# Patient Record
Sex: Female | Born: 1990 | Hispanic: No | Smoking: Current every day smoker
Health system: Southern US, Community
[De-identification: ages and names within clinical notes are randomized; demographics above are authoritative.]

## PROBLEM LIST (undated history)

## (undated) DIAGNOSIS — G43909 Migraine, unspecified, not intractable, without status migrainosus: Secondary | ICD-10-CM

## (undated) DIAGNOSIS — E282 Polycystic ovarian syndrome: Secondary | ICD-10-CM

## (undated) DIAGNOSIS — D219 Benign neoplasm of connective and other soft tissue, unspecified: Secondary | ICD-10-CM

## (undated) HISTORY — DX: Benign neoplasm of connective and other soft tissue, unspecified: D21.9

## (undated) HISTORY — DX: Migraine, unspecified, not intractable, without status migrainosus: G43.909

## (undated) HISTORY — DX: Polycystic ovarian syndrome: E28.2

## (undated) HISTORY — PX: NO PAST SURGERIES: SHX2092

---

## 2016-01-09 ENCOUNTER — Emergency Department
Admission: EM | Admit: 2016-01-09 | Discharge: 2016-01-09 | Disposition: A | Discharge status: Home / Self Care | Payer: BLUE CROSS/BLUE SHIELD | Attending: Emergency Medicine | Admitting: Emergency Medicine

## 2016-01-09 ENCOUNTER — Encounter: Payer: Self-pay | Admitting: Emergency Medicine

## 2016-01-09 DIAGNOSIS — T384X5A Adverse effect of oral contraceptives, initial encounter: Secondary | ICD-10-CM | POA: Insufficient documentation

## 2016-01-09 DIAGNOSIS — N939 Abnormal uterine and vaginal bleeding, unspecified: Secondary | ICD-10-CM

## 2016-01-09 DIAGNOSIS — F17200 Nicotine dependence, unspecified, uncomplicated: Secondary | ICD-10-CM | POA: Insufficient documentation

## 2016-01-09 LAB — POCT PREGNANCY, URINE: PREG TEST UR: NEGATIVE

## 2016-01-09 MED ORDER — KETOROLAC TROMETHAMINE 30 MG/ML IJ SOLN
30.0000 mg | Freq: Once | INTRAMUSCULAR | Status: AC
  Administered 2016-01-09: 30 mg via INTRAMUSCULAR
  Filled 2016-01-09: qty 1

## 2016-01-09 NOTE — Discharge Instructions (Signed)
Abnormal Uterine Bleeding Abnormal uterine bleeding means bleeding from the vagina that is not your normal menstrual period. This can be:  Bleeding or spotting between periods.  Bleeding after sex (sexual intercourse).  Bleeding that is heavier or more than normal.  Periods that last longer than usual.  Bleeding after menopause. There are many problems that may cause this. Treatment will depend on the cause of the bleeding. Any kind of bleeding that is not normal should be reviewed by your doctor.  HOME CARE Watch your condition for any changes. These actions may lessen any discomfort you are having:  Do not use tampons or douches as told by your doctor.  Change your pads often. You should get regular pelvic exams and Pap tests. Keep all appointments for tests as told by your doctor. GET HELP IF:  You are bleeding for more than 1 week.  You feel dizzy at times. GET HELP RIGHT AWAY IF:   You pass out.  You have to change pads every 15 to 30 minutes.  You have belly pain.  You have a fever.  You become sweaty or weak.  You are passing large blood clots from the vagina.  You feel sick to your stomach (nauseous) and throw up (vomit). MAKE SURE YOU:  Understand these instructions.  Will watch your condition.  Will get help right away if you are not doing well or get worse.   This information is not intended to replace advice given to you by your health care provider. Make sure you discuss any questions you have with your health care provider.   Document Released: 07/31/2009 Document Revised: 10/08/2013 Document Reviewed: 05/02/2013 Elsevier Interactive Patient Education 2016 [employer].  Oral Contraception Information Oral contraceptive pills (OCPs) are medicines taken to prevent pregnancy. OCPs work by preventing the ovaries from releasing eggs. The hormones in OCPs also cause the cervical mucus to thicken, preventing the sperm from entering the uterus. The  hormones also cause the uterine lining to become thin, not allowing a fertilized egg to attach to the inside of the uterus. OCPs are highly effective when taken exactly as prescribed. However, OCPs do not prevent sexually transmitted diseases (STDs). Safe sex practices, such as using condoms along with the pill, can help prevent STDs.  Before taking the pill, you may have a physical exam and Pap test. Your health care provider may order blood tests. The health care provider will make sure you are a good candidate for oral contraception. Discuss with your health care provider the possible side effects of the OCP you may be prescribed. When starting an OCP, it can take 2 to 3 months for the body to adjust to the changes in hormone levels in your body.  TYPES OF ORAL CONTRACEPTION  The combination pill--This pill contains estrogen and progestin (synthetic progesterone) hormones. The combination pill comes in 21-day, 28-day, or 91-day packs. Some types of combination pills are meant to be taken continuously (365-day pills). With 21-day packs, you do not take pills for 7 days after the last pill. With 28-day packs, the pill is taken every day. The last 7 pills are without hormones. Certain types of pills have more than 21 hormone-containing pills. With 91-day packs, the first 84 pills contain both hormones, and the last 7 pills contain no hormones or contain estrogen only.  The minipill--This pill contains the progesterone hormone only. The pill is taken every day continuously. It is very important to take the pill at the same time each  day. The minipill comes in packs of 28 pills. All 28 pills contain the hormone.  ADVANTAGES OF ORAL CONTRACEPTIVE PILLS  Decreases premenstrual symptoms.   Treats menstrual period cramps.   Regulates the menstrual cycle.   Decreases a heavy menstrual flow.   May treatacne, depending on the type of pill.   Treats abnormal uterine bleeding.   Treats polycystic  ovarian syndrome.   Treats endometriosis.   Can be used as emergency contraception.  THINGS THAT CAN MAKE ORAL CONTRACEPTIVE PILLS LESS EFFECTIVE OCPs can be less effective if:   You forget to take the pill at the same time every day.   You have a stomach or intestinal disease that lessens the absorption of the pill.   You take OCPs with other medicines that make OCPs less effective, such as antibiotics, certain HIV medicines, and some seizure medicines.   You take expired OCPs.   You forget to restart the pill on day 7, when using the packs of 21 pills.  RISKS ASSOCIATED WITH ORAL CONTRACEPTIVE PILLS  Oral contraceptive pills can sometimes cause side effects, such as:  Headache.  Nausea.  Breast tenderness.  Irregular bleeding or spotting. Combination pills are also associated with a small increased risk of:  Blood clots.  Heart attack.  Stroke.   This information is not intended to replace advice given to you by your health care provider. Make sure you discuss any questions you have with your health care provider.   Document Released: 12/24/2002 Document Revised: 07/24/2013 Document Reviewed: 03/24/2013 Elsevier Interactive Patient Education [employer].

## 2016-01-09 NOTE — ED Notes (Signed)
Assisted with pelvic exam.

## 2016-01-09 NOTE — ED Provider Notes (Signed)
[facility] Emergency Department Provider Note  ____________________________________________  Time seen: Approximately 1:13 PM  I have reviewed the triage vital signs and the nursing notes.   HISTORY  Chief Complaint Vaginal Bleeding    HPI Amanda Landry is a 25 y.o. female , NAD, presents to the emergency department with 1 week history of vaginal bleeding accompanied by abdominal cramping. States she is on [place] which is the generic version of [place]. States she's been on this medication before and had no adverse events.Over this last 3 months cycle she notes that she completed the first month and on the first day of the second month of pills she began to spot and bleed. States the bleeding at times and Flonase. Can have bleeding at night and then it stops in the morning and vice versa. Denies any vaginal discharge or changes in sexual partners. Does not believe that she is pregnant. Has had no lower back pain. Does not feel weak or fatigued. Has not had any chest pain,palpitations, racing heart, shortness of breath. Has an appointment with a new OB/GYN the first week of April. So notes she has a history of uterine fibroids.   History reviewed. No pertinent past medical history.  There are no active problems to display for this patient.   History reviewed. No pertinent past surgical history.  No current outpatient prescriptions on file.  Allergies Penicillins  History reviewed. No pertinent family history.  Social History Social History  Substance Use Topics  . Smoking status: Current Every Day Smoker  . Smokeless tobacco: None  . Alcohol Use: Yes     Review of Systems  Constitutional: No fever/chills, fatigue Eyes: No visual changes. Cardiovascular: No chest pain, heart racing, palpitations. Respiratory: No shortness of breath. No wheezing.  Gastrointestinal: Positive abdominal pain, cramping.  No nausea, vomiting.  No diarrhea.  No  constipation. Genitourinary: Positive vaginal bleeding. Negative for dysuria, hematuria. No urinary hesitancy, urgency or increased frequency. Musculoskeletal: Negative for back pain.  Skin: Negative for rash. Neurological: Negative for headaches, focal weakness or numbness. 10-point ROS otherwise negative.  ____________________________________________   PHYSICAL EXAM:  VITAL SIGNS: ED Triage Vitals  Enc Vitals Group     BP 01/09/16 1240 137/71 mmHg     Pulse Rate 01/09/16 1240 87     Resp 01/09/16 1240 20     Temp 01/09/16 1240 98.1 F (36.7 C)     Temp Source 01/09/16 1240 Oral     SpO2 01/09/16 1240 100 %     Weight 01/09/16 1240 170 lb (77.111 kg)     Height 01/09/16 1240 5\' 5"  (1.651 m)     Head Cir --      Peak Flow --      Pain Score 01/09/16 1241 6     Pain Loc --      Pain Edu? --      Excl. in [place]? --     Constitutional: Alert and oriented. Well appearing and in no acute distress. Eyes: Conjunctivae are normal.  Head: Atraumatic. Hematologic/Lymph:  No inguinal lymphadenopathy Cardiovascular: Good peripheral circulation. Respiratory: Normal respiratory effort without tachypnea or retractions.  Gastrointestinal: Soft and nontender in all quadrants. No distention, guarding.  Genitourinary:  External genitalia within normal limits and tender stage V. Vaginal vault without skin sores or swelling. No discharge or bleeding noted. Cervix within normal limits and atraumatic. No CMT. Musculoskeletal: No lower extremity tenderness nor edema.  No joint effusions. Neurologic:  Normal speech and language. No  gross focal neurologic deficits are appreciated.  Skin:  Skin is warm, dry and intact. No rash noted. Psychiatric: Mood and affect are normal. Speech and behavior are normal. Patient exhibits appropriate insight and judgement.   ____________________________________________   LABS (all labs ordered are listed, but only abnormal results are displayed)  Labs Reviewed   POCT PREGNANCY, URINE  POC URINE PREG, ED   ____________________________________________  EKG  None ____________________________________________  RADIOLOGY  None ____________________________________________    PROCEDURES  Procedure(s) performed: None    Medications  ketorolac (TORADOL) 30 MG/ML injection 30 mg (30 mg Intramuscular Given 01/09/16 1339)     ____________________________________________   INITIAL IMPRESSION / ASSESSMENT AND PLAN / ED COURSE  Pertinent lab results that were available during my care of the patient were reviewed by me and considered in my medical decision making (see chart for details).  Patient's diagnosis is consistent with abnormal uterine bleeding due to oral contraceptives.Patient will be discharged home with instructions to follow-up with her OB/GYN at in [facility] as currently scheduled.  Patient is given ED precautions to return to the ED for any worsening or new symptoms.    ____________________________________________  FINAL CLINICAL IMPRESSION(S) / ED DIAGNOSES  Final diagnoses:  Abnormal uterine bleeding  Oral contraceptive causing adverse effect in therapeutic use, initial encounter      NEW MEDICATIONS STARTED DURING THIS VISIT:  New Prescriptions   No medications on file         Braxton Feathers, PA-C 01/09/16 [place], MD 01/09/16 1553

## 2016-01-09 NOTE — ED Notes (Signed)
Pt to ed with c/o vaginal bleeding and pelvic cramping that started about 1 week ago.  Pt states she is on birth control that causes menstrual cycle every 3 months, however, this vaginal bleeding started on the first day of the second month.  Reports she took abx at the end of feb for toothache but did not have bleeding then.

## 2016-01-21 ENCOUNTER — Ambulatory Visit (INDEPENDENT_AMBULATORY_CARE_PROVIDER_SITE_OTHER): Payer: BLUE CROSS/BLUE SHIELD | Admitting: Obstetrics and Gynecology

## 2016-01-21 ENCOUNTER — Encounter: Payer: Self-pay | Admitting: Obstetrics and Gynecology

## 2016-01-21 VITALS — BP 117/71 | HR 83 | Ht 65.0 in | Wt 184.3 lb

## 2016-01-21 DIAGNOSIS — E282 Polycystic ovarian syndrome: Secondary | ICD-10-CM

## 2016-01-21 DIAGNOSIS — R638 Other symptoms and signs concerning food and fluid intake: Secondary | ICD-10-CM | POA: Insufficient documentation

## 2016-01-21 DIAGNOSIS — Z01419 Encounter for gynecological examination (general) (routine) without abnormal findings: Secondary | ICD-10-CM

## 2016-01-21 NOTE — Progress Notes (Signed)
Patient ID: Amanda Landry, female   DOB: [birth year], 25 y.o.   MRN: [id number] ANNUAL PREVENTATIVE CARE GYN  ENCOUNTER NOTE  Subjective:       Amanda Landry is a 25 y.o. [phone] female here for a routine annual gynecologic exam.  Current complaints: 1.  Want an u/s d/t pcos      Gynecologic History Patient's last menstrual period was 01/09/2016. Contraception: OCP (estrogen/progesterone) Last Pap: 2016. Results were: {wnl Menarche-eights 10 Intervals-irregular, every 2 weeks to every 2 months Duration of flow-5-7 days long birth control pills; 9-10 days off birth control pills (and heavy) Pap smear history: No abnormals STI history: Negative History of fibroids.  Obstetric History OB History  Gravida Para Term Preterm AB SAB TAB Ectopic Multiple Living  3 2 2  0 1 1 0 0 0 2    # Outcome Date GA Lbr Len/2nd Weight Sex Delivery Anes PTL Lv  3 SAB 2016          2 Term 2013   7 lb 1.6 oz (3.221 kg) M Vag-Spont   Y  1 Term 2011   5 lb 11.2 oz (2.586 kg) M Vag-Spont   Y      Past Medical History  Diagnosis Date  . PCOS (polycystic ovarian syndrome)   . Migraines   . Fibroid     History reviewed. No pertinent past surgical history.  No current outpatient prescriptions on file prior to visit.   No current facility-administered medications on file prior to visit.    Allergies  Allergen Reactions  . Penicillins Anaphylaxis    Social History   Social History  . Marital Status: Legally Separated    Spouse Name: N/A  . Number of Children: N/A  . Years of Education: N/A   Occupational History  . Not on file.   Social History Main Topics  . Smoking status: Current Every Day Smoker -- 0.25 packs/day    Types: Cigarettes  . Smokeless tobacco: Not on file  . Alcohol Use: Yes     Comment: weekly  . Drug Use: No  . Sexual Activity: Not Currently    Birth Control/ Protection: Pill   Other Topics Concern  . Not on file   Social History Narrative    Family  History  Problem Relation Age of Onset  . Cancer Neg Hx   . Diabetes Neg Hx   . Heart disease Neg Hx     The following portions of the patient's history were reviewed and updated as appropriate: allergies, current medications, past family history, past medical history, past social history, past surgical history and problem list.  Review of Systems ROS Review of Systems - General ROS: negative for - chills, fatigue, fever, hot flashes, night sweats, weight gain or weight loss Psychological ROS: negative for - anxiety, decreased libido, depression, mood swings, physical abuse or sexual abuse Ophthalmic ROS: negative for - blurry vision, eye pain or loss of vision ENT ROS: negative for - headaches, hearing change, visual changes or vocal changes Allergy and Immunology ROS: negative for - hives, itchy/watery eyes or seasonal allergies Hematological and Lymphatic ROS: negative for - bleeding problems, bruising, swollen lymph nodes or weight loss Endocrine ROS: negative for - galactorrhea, hair pattern changes, hot flashes, malaise/lethargy, mood swings, palpitations, polydipsia/polyuria, skin changes, temperature intolerance or unexpected weight changes Breast ROS: negative for - new or changing breast lumps or nipple discharge Respiratory ROS: negative for - cough or shortness of breath Cardiovascular ROS: negative for - chest  pain, irregular heartbeat, palpitations or shortness of breath Gastrointestinal ROS: no abdominal pain, change in bowel habits, or black or bloody stools Genito-Urinary ROS: no dysuria, trouble voiding, or hematuria Musculoskeletal ROS: negative for - joint pain or joint stiffness Neurological ROS: negative for - bowel and bladder control changes Dermatological ROS: negative for rash and skin lesion changes   Objective:   BP 117/71 mmHg  Pulse 83  Ht 5\' 5"  (1.651 m)  Wt 184 lb 4.8 oz (83.598 kg)  BMI 30.67 kg/m2  LMP 01/09/2016 CONSTITUTIONAL: Well-developed,  well-nourished female in no acute distress.  PSYCHIATRIC: Normal mood and affect. Normal behavior. Normal judgment and thought content. [place]: Alert and oriented to person, place, and time. Normal muscle tone coordination. No cranial nerve deficit noted. HENT:  Normocephalic, atraumatic, External right and left ear normal. Oropharynx is clear and moist EYES: Conjunctivae and EOM are normal. Pupils are equal, round, and reactive to light. No scleral icterus.  NECK: Normal range of motion, supple, no masses.  Normal thyroid.  SKIN: Skin is warm and dry. No rash noted. Not diaphoretic. No erythema. No pallor. CARDIOVASCULAR: Normal heart rate noted, regular rhythm, no murmur. RESPIRATORY: Clear to auscultation bilaterally. Effort and breath sounds normal, no problems with respiration noted. BREASTS: Symmetric in size. No masses, skin changes, nipple drainage, or lymphadenopathy. ABDOMEN: Soft, normal bowel sounds, no distention noted.  No tenderness, rebound or guarding.  BLADDER: Normal PELVIC:  External Genitalia: Normal  BUS: Normal  Vagina: Normal  Cervix: Normal  Uterus: Normal  Adnexa: Normal  RV: External Exam NormaI  MUSCULOSKELETAL: Normal range of motion. No tenderness.  No cyanosis, clubbing, or edema.  2+ distal pulses. LYMPHATIC: No Axillary, Supraclavicular, or Inguinal Adenopathy.    Assessment:   Annual gynecologic examination 25 y.o. Contraception: ocp Jolessa bmi-30  History of PCO History of irregular menstrual cycles  Plan:  Pap: Pap, Reflex if ASCUS-ct/ng Mammogram: Not Indicated Stool Guaiac Testing:  Not Indicated Labs: lipid vit d tsh a1c fbs Routine preventative health maintenance measures emphasized: Exercise/Diet/Weight control, Tobacco Warnings and Alcohol/Substance use risks Management of polycystic ovary disease was reviewed. For patient  not wanting to be pregnant, birth control pill use will be recommended. For patient wanting to get pregnant,  Clomid will be started Return to [place], CMA  Brayton Mars, MD  Note: This dictation was prepared with Dragon dictation along with smaller phrase technology. Any transcriptional errors that result from this process are unintentional.

## 2016-01-27 NOTE — Patient Instructions (Signed)
1. Pap smear 2. Self breast exam encouraged monthly 3. Healthy eating and exercise with gradual weight loss is encouraged 4. Refill oral contraceptives 5. Continue prenatal vitamins 6. Return in 1 year or as needed

## 2016-01-28 LAB — PAP IG, CT-NG, RFX HPV ASCU
Chlamydia, Nuc. Acid Amp: NEGATIVE
GONOCOCCUS BY NUCLEIC ACID AMP: NEGATIVE
PAP Smear Comment: 0

## 2016-02-03 ENCOUNTER — Ambulatory Visit: Admitting: Obstetrics and Gynecology

## 2016-02-03 ENCOUNTER — Telehealth: Payer: Self-pay | Admitting: *Deleted

## 2016-02-03 ENCOUNTER — Other Ambulatory Visit (INDEPENDENT_AMBULATORY_CARE_PROVIDER_SITE_OTHER): Payer: BLUE CROSS/BLUE SHIELD | Admitting: Obstetrics and Gynecology

## 2016-02-03 DIAGNOSIS — Z79899 Other long term (current) drug therapy: Secondary | ICD-10-CM

## 2016-02-03 DIAGNOSIS — E282 Polycystic ovarian syndrome: Secondary | ICD-10-CM

## 2016-02-03 LAB — POCT URINE PREGNANCY: Preg Test, Ur: NEGATIVE

## 2016-02-03 MED ORDER — MEDROXYPROGESTERONE ACETATE 10 MG PO TABS: 10.0000 mg | ORAL_TABLET | Freq: Every day | ORAL | Status: DC

## 2016-02-03 MED ORDER — CLOMIPHENE CITRATE 50 MG PO TABS: 50.0000 mg | ORAL_TABLET | Freq: Every day | ORAL | Status: DC

## 2016-02-03 NOTE — Progress Notes (Unsigned)
Per mad she needs to get upt. If upt is neg. Can send in clomid and provera. Advised to have progesterone drawn day 22.

## 2016-02-03 NOTE — Patient Instructions (Addendum)
Pt aware upt neg. Meds erx. Progesterone orderded.

## 2016-02-03 NOTE — Telephone Encounter (Signed)
Pt signed MRR this am for her 2 u/s. She stopped her ocp on 4-6. Would like a rx for clomid. Pt aware will send message to mad.

## 2016-02-03 NOTE — Telephone Encounter (Signed)
Patient called and wanted to touch base with you about her Ultrasound results that is coming in from another office.  Patient already signed a medical release form. Patient states you can call her when you receive the results. . Pt is requesting that Crystal call her back. Call back number [phone]. Thanks

## 2016-02-04 LAB — VITAMIN D 25 HYDROXY (VIT D DEFICIENCY, FRACTURES): VIT D 25 HYDROXY: 32.9 ng/mL (ref 30.0–100.0)

## 2016-02-04 LAB — LIPID PANEL
CHOL/HDL RATIO: 6.7 ratio — AB (ref 0.0–4.4)
Cholesterol, Total: 222 mg/dL — ABNORMAL HIGH (ref 100–199)
HDL: 33 mg/dL — AB (ref >39)
LDL Calculated: 127 mg/dL — ABNORMAL HIGH (ref 0–99)
Triglycerides: 312 mg/dL — ABNORMAL HIGH (ref 0–149)
VLDL Cholesterol Cal: 62 mg/dL — ABNORMAL HIGH (ref 5–40)

## 2016-02-04 LAB — GLUCOSE, RANDOM: Glucose: 109 mg/dL — ABNORMAL HIGH (ref 65–99)

## 2016-02-04 LAB — TSH: TSH: 0.805 u[IU]/mL (ref 0.450–4.500)

## 2016-02-04 LAB — HEMOGLOBIN A1C
ESTIMATED AVERAGE GLUCOSE: 111 mg/dL
HEMOGLOBIN A1C: 5.5 % (ref 4.8–5.6)

## 2016-02-04 NOTE — Telephone Encounter (Signed)
Pt stopped by office yesterday. upt neg. meds and labs ordered.

## 2016-02-12 ENCOUNTER — Telehealth: Payer: Self-pay | Admitting: Obstetrics and Gynecology

## 2016-02-12 NOTE — Telephone Encounter (Signed)
Pt aware if day 22 falls on weekend to come the day before to have drawn.

## 2016-02-12 NOTE — Telephone Encounter (Signed)
Amanda Landry HAS A QUESTON ABOUT HER PROVERA AND CLOMID

## 2016-03-09 ENCOUNTER — Encounter: Payer: Self-pay | Admitting: Obstetrics and Gynecology

## 2016-03-10 ENCOUNTER — Other Ambulatory Visit: Payer: BLUE CROSS/BLUE SHIELD

## 2016-03-10 DIAGNOSIS — E282 Polycystic ovarian syndrome: Secondary | ICD-10-CM

## 2016-03-11 LAB — PROGESTERONE: PROGESTERONE: 18.7 ng/mL

## 2016-03-18 ENCOUNTER — Telehealth: Payer: Self-pay | Admitting: Obstetrics and Gynecology

## 2016-03-18 NOTE — Telephone Encounter (Signed)
Ms. Ensminger called saying she doesn't think the Clomid worked. She didn't have a positive pregnancy test and believes her cycle will begin today. She's wondering if there's another medication she can take. She'd like a phone call regarding this.  Pt's ph# [phone] Thank you.

## 2016-03-21 MED ORDER — CLOMIPHENE CITRATE 50 MG PO TABS: 50.0000 mg | ORAL_TABLET | Freq: Every day | ORAL | Status: AC

## 2016-03-21 NOTE — Telephone Encounter (Signed)
Ok per mad- to refill clomid. Pt aware.

## 2016-04-26 ENCOUNTER — Encounter: Payer: Self-pay | Admitting: Obstetrics and Gynecology

## 2016-04-26 ENCOUNTER — Ambulatory Visit (INDEPENDENT_AMBULATORY_CARE_PROVIDER_SITE_OTHER): Payer: BLUE CROSS/BLUE SHIELD | Admitting: Obstetrics and Gynecology

## 2016-04-26 VITALS — BP 134/83 | HR 90 | Ht 65.0 in | Wt 183.4 lb

## 2016-04-26 DIAGNOSIS — N970 Female infertility associated with anovulation: Secondary | ICD-10-CM | POA: Insufficient documentation

## 2016-04-26 DIAGNOSIS — E282 Polycystic ovarian syndrome: Secondary | ICD-10-CM | POA: Diagnosis not present

## 2016-04-26 NOTE — Patient Instructions (Signed)
1. Clomid is on back order 2. Call if no menses by day 30 of cycle; we will then call in medroxyprogesterone acetate to cause a cycle which will then be followed by Clomid therapy as written. 3. Return in 4 months for follow-up 4. Continue vitamins daily

## 2016-04-26 NOTE — Progress Notes (Signed)
Chief complaint: 1. PCO 2. Anovulatory infertility  Patient presents for follow-up on Clomid therapy. She has taken it for 2 cycles now and had normal spontaneous menses at the appropriate time. Serum progesterone level was 18.6 which confirmed ovulatory status.  Patient cannot take Clomid therapy this month because of the medication being on back order  Patient will continue Clomid therapy when it is available up to a total of 6 cycles. If she does not conceive, we will then refer her to reproductive endocrinology for further management  ASSESSMENT: 1. PCO 2. Anovulatory infertility 3. Successful Clomid ovulation induction based on day 22 serum progesterone at 50 mg per day dosage  PLAN: 1. Continue Clomid for a total of 6 cycles. 2. Patient is to hold off on medication this month because of Clomid back order. 3. Return in 4 months for follow-up or sooner if pregnant 4. Continue prenatal vitamins  A total of 15 minutes were spent face-to-face with the patient during this encounter and over half of that time dealt with counseling and coordination of care.  Brayton Mars, MD  Note: This dictation was prepared with Dragon dictation along with smaller phrase technology. Any transcriptional errors that result from this process are unintentional.

## 2016-08-30 ENCOUNTER — Encounter: Payer: Self-pay | Admitting: Obstetrics and Gynecology

## 2016-08-30 ENCOUNTER — Emergency Department
Admission: EM | Admit: 2016-08-30 | Discharge: 2016-08-30 | Disposition: A | Discharge status: Home / Self Care | Payer: BLUE CROSS/BLUE SHIELD | Attending: Emergency Medicine | Admitting: Emergency Medicine

## 2016-08-30 ENCOUNTER — Ambulatory Visit (INDEPENDENT_AMBULATORY_CARE_PROVIDER_SITE_OTHER): Payer: BLUE CROSS/BLUE SHIELD | Admitting: Obstetrics and Gynecology

## 2016-08-30 ENCOUNTER — Encounter: Payer: Self-pay | Admitting: Emergency Medicine

## 2016-08-30 VITALS — BP 117/67 | HR 92 | Ht 65.0 in | Wt 197.1 lb

## 2016-08-30 DIAGNOSIS — Z79899 Other long term (current) drug therapy: Secondary | ICD-10-CM | POA: Insufficient documentation

## 2016-08-30 DIAGNOSIS — G43109 Migraine with aura, not intractable, without status migrainosus: Secondary | ICD-10-CM | POA: Insufficient documentation

## 2016-08-30 DIAGNOSIS — F17210 Nicotine dependence, cigarettes, uncomplicated: Secondary | ICD-10-CM | POA: Diagnosis not present

## 2016-08-30 DIAGNOSIS — N970 Female infertility associated with anovulation: Secondary | ICD-10-CM | POA: Diagnosis not present

## 2016-08-30 DIAGNOSIS — G43909 Migraine, unspecified, not intractable, without status migrainosus: Secondary | ICD-10-CM | POA: Diagnosis present

## 2016-08-30 MED ORDER — SUMATRIPTAN SUCCINATE 100 MG PO TABS: 100.0000 mg | ORAL_TABLET | Freq: Once | ORAL | 11 refills | Status: DC | PRN

## 2016-08-30 MED ORDER — BUTALBITAL-APAP-CAFFEINE 50-325-40 MG PO TABS
2.0000 | ORAL_TABLET | ORAL | Status: AC
  Administered 2016-08-30: 2 via ORAL
  Filled 2016-08-30: qty 2

## 2016-08-30 MED ORDER — ONDANSETRON HCL 4 MG PO TABS
4.0000 mg | ORAL_TABLET | Freq: Once | ORAL | Status: AC
  Administered 2016-08-30: 4 mg via ORAL
  Filled 2016-08-30: qty 1

## 2016-08-30 MED ORDER — BUTALBITAL-APAP-CAFFEINE 50-325-40 MG PO TABS: 1.0000 | ORAL_TABLET | Freq: Four times a day (QID) | ORAL | 0 refills | Status: AC | PRN

## 2016-08-30 NOTE — ED Triage Notes (Signed)
Patient presents to the ED with headache x 3 weeks that she reports felt like it got worse this morning.  Patient reports nausea and blurry vision today.  Patient reports history of migraines but states she was in middle school with the last one that was this bad.  Patient states she used to take several medications for migraines but hasn't in years because they haven't been a problem.  Patient saw her PCP this morning and was prescribed sumatriptan but patient states she feels like her headache got worse and not better.  Patient reports photophobia.

## 2016-08-30 NOTE — Progress Notes (Signed)
Chief complaint: 1. Secondary anovulatory infertility 2. New onset migraine headaches  Patient has completed 3 cycles of Clomid for infertility without success. She is wanting to proceed with 3 more cycles now that her husband is back from [employer].  Patient has been experiencing almost daily migraines type headaches over the past 6 weeks. Headaches occur typically in the morning, occur in the occipital as well as right parietal lobe areas, are typically associated with  some nausea and visual changes. Patient has not had workup for this condition in the past.  OBJECTIVE: BP 117/67   Pulse 92   Ht 5\' 5"  (1.651 m)   Wt 197 lb 1.6 oz (89.4 kg)   LMP 08/07/2016   BMI 32.80 kg/m  Pleasant female in no acute distress. Alert and oriented. Affect is appropriate. PERRLA EOMI No neck stiffness or nuchal rigidity  ASSESSMENT: 1. 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 New onset migraine headaches with aura  PLAN: 1. Continue with Clomid ovulation induction for another 3 months 2. CT scan of the head with and without contrast 3. Imitrex 100 mg orally at onset of headache; repeat 1 dose if headache persists after 2 hours 4. Return in 3 months for follow-up  A total of 25 minutes were spent face-to-face with the patient during this encounter and over half of that time involved counseling and coordination of care.  Brayton Mars, MD  Note: This dictation was prepared with Dragon dictation along with smaller phrase technology. Any transcriptional errors that result from this process are unintentional.

## 2016-08-30 NOTE — ED Provider Notes (Signed)
[facility] Emergency Department Provider Note ____________________________________________  Time seen: Approximately 12:59 PM  I have reviewed the triage vital signs and the nursing notes.   HISTORY  Chief Complaint Migraine   HPI Amanda Landry is a 25 y.o. female who presents with a worsening migraine for 4 weeks. Patient has light and sound sensitivity. Patient has felt constantly nauseous. The pain is primarily in the back of her head and radiates like a band around her head. The patient characterizes the pain as throbbing.  Patient has been having migraines since the age of 41 and negative neurologist from ages [age]. Patient has had migraines on and off for years. They have been mild for the last couple years. The last bad migraine she has had was about 6 years ago. Patient saw a gynecologist this morning, who gave her sumatriptan. Patient has taken sumatriptan, ibuprofen, Tylenol, which have not helped.  Past Medical History:  Diagnosis Date  . Fibroid   . Migraines   . PCOS (polycystic ovarian syndrome)     Patient Active Problem List   Diagnosis Date Noted  . Migraine with aura and without status migrainosus, not intractable 08/30/2016  . 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 PCO (polycystic ovaries) 01/21/2016  . Increased BMI 01/21/2016    Past Surgical History:  Procedure Laterality Date  . NO PAST SURGERIES      Prior to Admission medications   Medication Sig Start Date End Date Taking? Authorizing Provider  butalbital-acetaminophen-caffeine (FIORICET, ESGIC) 50-325-40 MG tablet Take 1-2 tablets by mouth every 6 (six) hours as needed for headache. 08/30/16 08/30/17  Victorino Dike, FNP  clomiPHENE (CLOMID) 50 MG tablet Take 1 tablet (50 mg total) by mouth daily. Take days 5 through 9 03/21/16   Brayton Mars, MD  Prenatal Vit-Fe Fumarate-FA (PRENATAL MULTIVITAMIN) TABS tablet Take 1 tablet by mouth daily at 12 noon.     Historical Provider, MD  SUMAtriptan (IMITREX) 100 MG tablet Take 1 tablet (100 mg total) by mouth once as needed for migraine. May repeat in 2 hours if headache persists or recurs. 08/30/16   Brayton Mars, MD    Allergies Penicillins  Family History  Problem Relation Age of Onset  . Cancer Neg Hx   . Diabetes Neg Hx   . Heart disease Neg Hx     Social History Social History  Substance Use Topics  . Smoking status: Current Every Day Smoker    Packs/day: 0.25    Types: Cigarettes  . Smokeless tobacco: Never Used  . Alcohol use No     Comment: weekly    Review of Systems Constitutional: No recent illness. HEENT: No double or blurry vision.  Cardiovascular: Denies chest pain or palpitations. Respiratory: Denies shortness of breath. Abdominal: No vomiting Skin: Negative for rash, wound, lesion. Neurological: Negative for focal weakness or numbness. ____________________________________________  PHYSICAL EXAM:  VITAL SIGNS: ED Triage Vitals  Enc Vitals Group     BP 08/30/16 1220 123/76     Pulse Rate 08/30/16 1220 77     Resp 08/30/16 1220 18     Temp 08/30/16 1220 98.4 F (36.9 C)     Temp Source 08/30/16 1220 Oral     SpO2 08/30/16 1220 97 %     Weight 08/30/16 1221 195 lb (88.5 kg)     Height 08/30/16 1221 5\' 5"  (1.651 m)     Head Circumference --      Peak Flow --  Pain Score 08/30/16 1221 10     Pain Loc --      Pain Edu? --      Excl. in [place]? --     Constitutional: Alert and oriented. Well appearing and in no acute distress. Eyes: Conjunctivae are normal. EOMI. Head: Atraumatic. Neck: No stridor.  Respiratory: Normal respiratory effort.   Musculoskeletal: Full ROM and full strength in all extremities.  Neurologic:  Normal speech and language. No gross focal neurologic deficits are appreciated. No gait instability. Cranial nerves: 2-10 normal as tested. Cerebellar:Normal Romberg, finger-nose-finger, normal gait. Sensorimotor: No aphasia,  pronator drift, clonus, sensory loss or abnormal reflexes.  Skin:  Skin is warm, dry and intact. Atraumatic. Psychiatric: Mood and affect are normal. Speech and behavior are normal. ____________________________________________   LABS (all labs ordered are listed, but only abnormal results are displayed)  Labs Reviewed - No data to display ____________________________________________   INITIAL IMPRESSION / ASSESSMENT AND PLAN / ED COURSE  Clinical Course     Pertinent labs & imaging results that were available during my care of the patient were reviewed by me and considered in my medical decision making (see chart for details).  My assessment is that this is most likely a migraine similar to the migraines she has had in the past. Patient stated that her gyn set up a CT for her as an outpatient, which she was instructed to follow up on. Patient was also instructed to follow up with PCP. Patient felt better after leaving the ED but was instructed to return if symptoms worsen or change.  ____________________________________________   FINAL CLINICAL IMPRESSION(S) / ED DIAGNOSES  Final diagnoses:  Migraine with aura and without status migrainosus, not intractable       Laban Emperor, PA-C 08/30/16 [place], MD 08/30/16 2351

## 2016-08-30 NOTE — Patient Instructions (Addendum)
1. Head CT scan with and without contrast is ordered for assessment of migraine headaches, new onset 2. Continue with 3 months of Clomid therapy for secondary anovulatory infertility 3. Return in 3 months for follow-up 4. Imitrex 100 mg when necessary headache; may repeat in 2 hours if headache persists

## 2016-08-30 NOTE — Discharge Instructions (Signed)
Follow up with neurology for symptoms that return.

## 2016-09-04 ENCOUNTER — Emergency Department: Payer: BLUE CROSS/BLUE SHIELD

## 2016-09-04 ENCOUNTER — Emergency Department
Admission: EM | Admit: 2016-09-04 | Discharge: 2016-09-04 | Disposition: A | Discharge status: Home / Self Care | Payer: BLUE CROSS/BLUE SHIELD | Attending: Emergency Medicine | Admitting: Emergency Medicine

## 2016-09-04 ENCOUNTER — Encounter: Payer: Self-pay | Admitting: Emergency Medicine

## 2016-09-04 DIAGNOSIS — G4489 Other headache syndrome: Secondary | ICD-10-CM | POA: Diagnosis not present

## 2016-09-04 DIAGNOSIS — Y249XXA Unspecified firearm discharge, undetermined intent, initial encounter: Secondary | ICD-10-CM | POA: Insufficient documentation

## 2016-09-04 DIAGNOSIS — N9489 Other specified conditions associated with female genital organs and menstrual cycle: Secondary | ICD-10-CM | POA: Insufficient documentation

## 2016-09-04 DIAGNOSIS — F17210 Nicotine dependence, cigarettes, uncomplicated: Secondary | ICD-10-CM | POA: Insufficient documentation

## 2016-09-04 DIAGNOSIS — Z79899 Other long term (current) drug therapy: Secondary | ICD-10-CM | POA: Insufficient documentation

## 2016-09-04 DIAGNOSIS — T5894XA Toxic effect of carbon monoxide from unspecified source, undetermined, initial encounter: Secondary | ICD-10-CM

## 2016-09-04 DIAGNOSIS — R51 Headache: Secondary | ICD-10-CM | POA: Diagnosis present

## 2016-09-04 DIAGNOSIS — R519 Headache, unspecified: Secondary | ICD-10-CM

## 2016-09-04 LAB — BLOOD GAS, ARTERIAL
Acid-base deficit: 0.1 mmol/L (ref 0.0–2.0)
BICARBONATE: 23.9 mmol/L (ref 20.0–28.0)
FIO2: 0.21
O2 SAT: 94.5 %
PCO2 ART: 36 mmHg (ref 32.0–48.0)
PO2 ART: 71 mmHg — AB (ref 83.0–108.0)
Patient temperature: 37
pH, Arterial: 7.43 (ref 7.350–7.450)

## 2016-09-04 LAB — BLOOD GAS, VENOUS
Acid-base deficit: 3.3 mmol/L — ABNORMAL HIGH (ref 0.0–2.0)
Bicarbonate: 21.3 mmol/L (ref 20.0–28.0)
FIO2: 0.21
O2 SAT: 92.6 %
PATIENT TEMPERATURE: 37
PO2 VEN: 67 mmHg — AB (ref 32.0–45.0)
pCO2, Ven: 36 mmHg — ABNORMAL LOW (ref 44.0–60.0)
pH, Ven: 7.38 (ref 7.250–7.430)

## 2016-09-04 LAB — HCG, QUANTITATIVE, PREGNANCY: hCG, Beta Chain, Quant, S: <1 m[IU]/mL (ref <5)

## 2016-09-04 LAB — COMPREHENSIVE METABOLIC PANEL
ALK PHOS: 51 U/L (ref 38–126)
ALT: 14 U/L (ref 14–54)
AST: 28 U/L (ref 15–41)
Albumin: 4 g/dL (ref 3.5–5.0)
Anion gap: 9 (ref 5–15)
BILIRUBIN TOTAL: 0.6 mg/dL (ref 0.3–1.2)
BUN: 9 mg/dL (ref 6–20)
CALCIUM: 9.4 mg/dL (ref 8.9–10.3)
CO2: 22 mmol/L (ref 22–32)
CREATININE: 0.95 mg/dL (ref 0.44–1.00)
Chloride: 108 mmol/L (ref 101–111)
GFR calc Af Amer: >60 mL/min (ref >60)
GLUCOSE: 96 mg/dL (ref 65–99)
Potassium: 4.1 mmol/L (ref 3.5–5.1)
Sodium: 139 mmol/L (ref 135–145)
TOTAL PROTEIN: 7.5 g/dL (ref 6.5–8.1)

## 2016-09-04 LAB — URINE DRUG SCREEN, QUALITATIVE (ARMC ONLY)
Amphetamines, Ur Screen: NOT DETECTED
BARBITURATES, UR SCREEN: POSITIVE — AB
Benzodiazepine, Ur Scrn: NOT DETECTED
CANNABINOID 50 NG, UR ~~LOC~~: NOT DETECTED
Cocaine Metabolite,Ur ~~LOC~~: NOT DETECTED
MDMA (ECSTASY) UR SCREEN: NOT DETECTED
Methadone Scn, Ur: NOT DETECTED
Opiate, Ur Screen: NOT DETECTED
PHENCYCLIDINE (PCP) UR S: NOT DETECTED
TRICYCLIC, UR SCREEN: NOT DETECTED

## 2016-09-04 LAB — CBC WITH DIFFERENTIAL/PLATELET
BASOS ABS: 0.1 10*3/uL (ref 0–0.1)
BASOS PCT: 1 %
Eosinophils Absolute: 0 10*3/uL (ref 0–0.7)
Eosinophils Relative: 0 %
HEMATOCRIT: 41.3 % (ref 35.0–47.0)
HEMOGLOBIN: 14.5 g/dL (ref 12.0–16.0)
LYMPHS PCT: 26 %
Lymphs Abs: 3.8 10*3/uL — ABNORMAL HIGH (ref 1.0–3.6)
MCH: 31.1 pg (ref 26.0–34.0)
MCHC: 35.2 g/dL (ref 32.0–36.0)
MCV: 88.3 fL (ref 80.0–100.0)
MONO ABS: 0.8 10*3/uL (ref 0.2–0.9)
Monocytes Relative: 6 %
NEUTROS ABS: 9.9 10*3/uL — AB (ref 1.4–6.5)
NEUTROS PCT: 67 %
Platelets: 370 10*3/uL (ref 150–440)
RBC: 4.68 MIL/uL (ref 3.80–5.20)
RDW: 12.5 % (ref 11.5–14.5)
WBC: 14.7 10*3/uL — ABNORMAL HIGH (ref 3.6–11.0)

## 2016-09-04 LAB — URINALYSIS COMPLETE WITH MICROSCOPIC (ARMC ONLY)
BACTERIA UA: NONE SEEN
Bilirubin Urine: NEGATIVE
Glucose, UA: NEGATIVE mg/dL
HGB URINE DIPSTICK: NEGATIVE
KETONES UR: NEGATIVE mg/dL
NITRITE: NEGATIVE
PH: 6 (ref 5.0–8.0)
PROTEIN: NEGATIVE mg/dL
SPECIFIC GRAVITY, URINE: 1.012 (ref 1.005–1.030)

## 2016-09-04 LAB — LACTIC ACID, PLASMA
LACTIC ACID, VENOUS: 3.1 mmol/L — AB (ref 0.5–1.9)
LACTIC ACID, VENOUS: 0.9 mmol/L (ref 0.5–1.9)

## 2016-09-04 LAB — CARBOXYHEMOGLOBIN - COOX: Carboxyhemoglobin: 2.9 % — ABNORMAL HIGH (ref 0.5–1.5)

## 2016-09-04 LAB — TROPONIN I

## 2016-09-04 LAB — PREGNANCY, URINE: Preg Test, Ur: NEGATIVE

## 2016-09-04 LAB — SEDIMENTATION RATE: SED RATE: 13 mm/h (ref 0–20)

## 2016-09-04 MED ORDER — DIPHENHYDRAMINE HCL 25 MG PO CAPS: 50.0000 mg | ORAL_CAPSULE | Freq: Four times a day (QID) | ORAL | 0 refills | Status: AC | PRN

## 2016-09-04 MED ORDER — ONDANSETRON HCL 4 MG/2ML IJ SOLN
4.0000 mg | Freq: Once | INTRAMUSCULAR | Status: AC
  Administered 2016-09-04: 4 mg via INTRAVENOUS
  Filled 2016-09-04: qty 2

## 2016-09-04 MED ORDER — PROCHLORPERAZINE EDISYLATE 5 MG/ML IJ SOLN
10.0000 mg | Freq: Once | INTRAMUSCULAR | Status: AC
  Administered 2016-09-04: 10 mg via INTRAVENOUS
  Filled 2016-09-04: qty 2

## 2016-09-04 MED ORDER — METOCLOPRAMIDE HCL 10 MG PO TABS: 10.0000 mg | ORAL_TABLET | Freq: Four times a day (QID) | ORAL | 0 refills | Status: AC | PRN

## 2016-09-04 MED ORDER — LORAZEPAM 2 MG/ML IJ SOLN
1.0000 mg | Freq: Once | INTRAMUSCULAR | Status: AC
  Administered 2016-09-04: 1 mg via INTRAVENOUS

## 2016-09-04 MED ORDER — DIPHENHYDRAMINE HCL 50 MG/ML IJ SOLN
INTRAMUSCULAR | Status: AC
  Administered 2016-09-04: 25 mg via INTRAVENOUS
  Filled 2016-09-04: qty 1

## 2016-09-04 MED ORDER — SODIUM CHLORIDE 0.9 % IV BOLUS (SEPSIS)
1000.0000 mL | Freq: Once | INTRAVENOUS | Status: AC
  Administered 2016-09-04: 1000 mL via INTRAVENOUS

## 2016-09-04 MED ORDER — DIPHENHYDRAMINE HCL 50 MG/ML IJ SOLN
25.0000 mg | Freq: Once | INTRAMUSCULAR | Status: AC
  Administered 2016-09-04: 25 mg via INTRAVENOUS

## 2016-09-04 MED ORDER — LORAZEPAM 2 MG/ML IJ SOLN
INTRAMUSCULAR | Status: AC
  Administered 2016-09-04: 1 mg via INTRAVENOUS
  Filled 2016-09-04: qty 1

## 2016-09-04 NOTE — ED Notes (Signed)
RT called for ABG per Dr. Cinda Quest

## 2016-09-04 NOTE — ED Notes (Signed)
Respiratory at bedside for arterial blood draw.

## 2016-09-04 NOTE — ED Notes (Signed)
Patient assisted to use bed pan. Patient able to turn and reposition self. Specimen sent to lab.

## 2016-09-04 NOTE — ED Triage Notes (Signed)
Pt from work via [employer]. Patient reports having a severe headache that started "37 days ago". She reports being seen at PCP and given fioricet for migraines. Patient reports no relief with this prescription. According to coworkers patient experienced seizure-like activity for several minutes prior to EMS arrival. Per EMS, patient had several episodes of tremors en route to hospital. Upon arrival to ED, patient had an episode of tremors. Patient is also posturing with feet turned in and slow to respond to questions.

## 2016-09-04 NOTE — Discharge Instructions (Signed)
You were seen in the emergency room today for headache. You had an elevated carboxyhemoglobin level which improved with time and extra oxygen. It is very important that you get your new heater inspected before staying in the house again as it may be releasing carbon monoxide which can cause fatal poisoning. You can call the fire department to request a carbon monoxide testing of your house. You should get a carbon monoxide detector for each floor of your house.

## 2016-09-04 NOTE — ED Provider Notes (Signed)
Repeat labs obtained. Carboxyhemoglobin normalized. Lactate normalized. Vital signs normal. Patient requested to remove supplemental oxygen and she felt better without it. Patient discharged home, symptomatic relief for headache. Given strong warnings about carbon monoxide exposure in home and to have her new heater inspected before staying in the house again.  Given the following advice on her printed instructions as well: You were seen in the emergency room today for headache. You had an elevated carboxyhemoglobin level which improved with time and extra oxygen. It is very important that you get your new heater inspected before staying in the house again as it may be releasing carbon monoxide which can cause fatal poisoning. You can call the fire department to request a carbon monoxide testing of your house. You should get a carbon monoxide detector for each floor of your house.  Final diagnoses:  Other headache syndrome  Acute nonintractable headache, unspecified headache type  Carboxyhemoglobinemia, undetermined intent, initial encounter      Carrie Mew, MD 09/04/16 1730

## 2016-09-04 NOTE — ED Provider Notes (Addendum)
[facility] Emergency Department Provider Note   ____________________________________________   None    (approximate)  I have reviewed the triage vital signs and the nursing notes.   HISTORY  Chief Complaint Headache  Patient seems to be unwilling or unable to answer questions.  HPI Amanda Landry is a 25 y.o. female patient comes in with EMS report seizure-like activity. Patient is shaking her arms and legs. Patient seems to have carpopedal spasm except for that her hands under spasm-fingers are stretched out straight. Patient is awake and says she's had a headache for 37 days straight. She does this while she is shaking intermittently. She cannot tell me if it hurts to touch her but when I touch her size of her head she begins shaking again. And moans. She then stops after bitten answer some more questions and then stops answering questions and lays there and shakes occasionally. Patient is not really answering most other questions. Review of her records show a recent visit for migraine headaches with sound and light sensitivity and nausea. She had been given sumatriptan without apparent relief.  EMS picked her up from [employer] [facility]. 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 They do not believe she was exposed to any chemicals at all.  Past Medical History:  Diagnosis Date  . Fibroid   . Migraines   . PCOS (polycystic ovarian syndrome)     Patient Active Problem List   Diagnosis Date Noted  . Migraine with aura and without status migrainosus, not intractable 08/30/2016  . 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 PCO (polycystic ovaries) 01/21/2016  . Increased BMI 01/21/2016    Past Surgical History:  Procedure Laterality Date  . NO PAST SURGERIES      Prior to Admission medications   Medication Sig Start Date End Date Taking? Authorizing Provider   butalbital-acetaminophen-caffeine (FIORICET, ESGIC) 50-325-40 MG tablet Take 1-2 tablets by mouth every 6 (six) hours as needed for headache. 08/30/16 08/30/17 Yes Victorino Dike, FNP  Prenatal Vit-Fe Fumarate-FA (PRENATAL MULTIVITAMIN) TABS tablet Take 1 tablet by mouth daily at 12 noon.   Yes Historical Provider, MD  clomiPHENE (CLOMID) 50 MG tablet Take 1 tablet (50 mg total) by mouth daily. Take days 5 through 9 Patient not taking: Reported on 09/04/2016 03/21/16   Alanda Slim Defrancesco, MD    Allergies Penicillins and Sumatriptan  Family History  Problem Relation Age of Onset  . Cancer Neg Hx   . Diabetes Neg Hx   . Heart disease Neg Hx     Social History Social History  Substance Use Topics  . Smoking status: Current Every Day Smoker    Packs/day: 0.25    Types: Cigarettes  . Smokeless tobacco: Never Used  . Alcohol use No     Comment: weekly    Review of Systems   __Unobtainable __________________________________________   PHYSICAL EXAM:  VITAL SIGNS: ED Triage Vitals  Enc Vitals Group     BP      Pulse      Resp      Temp      Temp src      SpO2      Weight      Height      Head Circumference      Peak Flow      Pain Score      Pain Loc      Pain Edu?  Excl. in [place]?     Constitutional:Patient will open her eyes and respond appropriately to most questions to many questions but then closes her eyes and puts her head to the side and shakes. She then wakes up and answers more questions and puts her at the side and shakes again. Eyes: Conjunctivae are normal. PERRL. EOMI. Head: Atraumatic as near as I can tell through her thick braids.. Nose: No congestion/rhinnorhea. Mouth/Throat: Mucous membranes are moist.  Oropharynx non-erythematous. Neck: No stridor.   Cardiovascular: Normal rate, regular rhythm. Grossly normal heart sounds.  Good peripheral circulation. Respiratory: Normal respiratory effort.  No retractions. Lungs CTAB. Gastrointestinal: Soft  and nontender. No distention. No abdominal bruits. No apparent CVA tenderness. Musculoskeletal: No lower extremity tenderness nor edema.  No joint effusions. Neurologic:  See description of patient's activity history of present illness patient really will not move her feet or follow commands with arms or legs. d.   ____________________________________________   LABS (all labs ordered are listed, but only abnormal results are displayed)  Labs Reviewed  LACTIC ACID, PLASMA - Abnormal; Notable for the following:       Result Value   Lactic Acid, Venous 3.1 (*)    All other components within normal limits  CBC WITH DIFFERENTIAL/PLATELET - Abnormal; Notable for the following:    WBC 14.7 (*)    Neutro Abs 9.9 (*)    Lymphs Abs 3.8 (*)    All other components within normal limits  URINALYSIS COMPLETEWITH MICROSCOPIC ([facility] ONLY) - Abnormal; Notable for the following:    Color, Urine YELLOW (*)    APPearance CLOUDY (*)    Leukocytes, UA 2+ (*)    Squamous Epithelial / LPF 6-30 (*)    All other components within normal limits  URINE DRUG SCREEN, QUALITATIVE ([facility] ONLY) - Abnormal; Notable for the following:    Barbiturates, Ur Screen POSITIVE (*)    All other components within normal limits  BLOOD GAS, VENOUS - Abnormal; Notable for the following:    pCO2, Ven 36 (*)    pO2, Ven 67.0 (*)    Acid-base deficit 3.3 (*)    All other components within normal limits  BLOOD GAS, ARTERIAL - Abnormal; Notable for the following:    pO2, Arterial 71 (*)    All other components within normal limits  COMPREHENSIVE METABOLIC PANEL  TROPONIN I  PREGNANCY, URINE  HCG, QUANTITATIVE, PREGNANCY  CARBOXYHEMOGLOBIN - COOX  LACTIC ACID, PLASMA  SEDIMENTATION RATE  LACTIC ACID, PLASMA  LACTIC ACID, PLASMA  CARBOXYHEMOGLOBIN - COOX   ____________________________________________  EKG  EKG read and is turbid by me shows sinus tachycardia rate of 117 normal axis patient has diffuse T-wave  flattening. Patient heart rate goes up as high as 145 on the monitor. We'll go up and come back down is almost normal sinus ____________________________________________  RADIOLOGY  Study Result   CLINICAL DATA:  Smoker, passed out at work today. Headaches  EXAM: PORTABLE CHEST 1 VIEW  COMPARISON:  None.  FINDINGS: Numerous leads and wires project over the chest. Midline trachea. Normal heart size and mediastinal contours. No pleural effusion or pneumothorax. Vague increased density projecting over the left lower chest is favored to be due to overlying breast tissue. Lungs otherwise clear.  IMPRESSION: No acute cardiopulmonary disease.   Electronically Signed   By: Abigail Miyamoto M.D.   On: 09/04/2016 14:32   Study Result   CLINICAL DATA:  Headache for 37 days.  EXAM: CT HEAD WITHOUT CONTRAST  TECHNIQUE:  Contiguous axial images were obtained from the base of the skull through the vertex without intravenous contrast.  COMPARISON:  None.  FINDINGS: Brain: No evidence of acute infarction, hemorrhage, hydrocephalus, extra-axial collection or mass lesion/mass effect.  Vascular: No hyperdense vessel or unexpected calcification.  Skull: Normal. Negative for fracture or focal lesion.  Sinuses/Orbits: No acute finding.  Other: None.  IMPRESSION: Normal.   Electronically Signed   By: Dorise Bullion III M.D   On: 09/04/2016 14:49     ____________________________________________   PROCEDURES  Procedure(s) performed:   Procedures  Critical Care performed:   ____________________________________________   INITIAL IMPRESSION / ASSESSMENT AND PLAN / ED COURSE  Pertinent labs & imaging results that were available during my care of the patient were reviewed by me and considered in my medical decision making (see chart for details).    Clinical Course   ----------------------------------------- 3:35 PM on  09/04/2016 -----------------------------------------  Patient says her headache is still throbbing. Patient is awake alert able to give a good history which again says that she smells exhausted trucks as noted below and smokes half pack a day etc. she says her head is still throbbing. Neurological exam shows pupils equal round reactive to light extraocular movements intact cranial nerves II through XII are intact except visual fields were not checked. Cerebellar finger-nose rapid alternating movements and hands is normal motor strength seems to be slightly weak throughout but is equal and symmetrical sensation is intact throughout patient has barely perceptible DTRs on initial exam they were not repeated on this exam The below note was put in at approximately 3:00  patient put on 100% oxygen by facemask for elevated carboxyhemoglobin reportedly 5.6. Patient smokes a half pack a day reports she smells exhausted trucks when the blood to [place] frequently and says she has a gas fireplace which she started recently no one else in the house is sick though I told her she needs to get the fireplace checked before she goes back in the house. I will repeat the carboxyhemoglobin and the lactic acid. I will give her some Compazine for the headache. I'm signing the patient out to Dr. Joni Fears I should add that I also talked to Dr. Doy Mince a neurologist about this patient she feels if the sedimentation rate is within normal limits she would not think the patient needed to come into the emergency room did not sound like seizures.  ____________________________________________   FINAL CLINICAL IMPRESSION(S) / ED DIAGNOSES  Final diagnoses:  Other headache syndrome      NEW MEDICATIONS STARTED DURING THIS VISIT:  New Prescriptions   No medications on file     Note:  This document was prepared using Dragon voice recognition software and may include unintentional dictation errors.    Nena Polio,  MD 09/04/16 [place], MD 09/04/16 [phone]

## 2016-09-04 NOTE — ED Notes (Signed)
PT reports the oxygen is making her head hurt worse and has make her headache spread. Pt verbalized desire to removed oxygen and MD verbalized this was okay. Pt also verbalized desire to have food. MD verbalized pt could eat at this time.

## 2016-09-06 ENCOUNTER — Ambulatory Visit

## 2016-09-07 LAB — CARBOXYHEMOGLOBIN - COOX: Carboxyhemoglobin: 2.9 % — ABNORMAL HIGH (ref 0.5–1.5)

## 2016-09-13 ENCOUNTER — Ambulatory Visit: Admission: RE | Admit: 2016-09-13 | Payer: BLUE CROSS/BLUE SHIELD | Source: Ambulatory Visit

## 2016-11-29 ENCOUNTER — Encounter: Payer: BLUE CROSS/BLUE SHIELD | Admitting: Obstetrics and Gynecology

## 2017-01-23 NOTE — Progress Notes (Deleted)
Patient ID: Amanda Landry, female   DOB: [birth year], 26 y.o.   MRN: [id number] ANNUAL PREVENTATIVE CARE GYN  ENCOUNTER NOTE  Subjective:       Amanda Landry is a 26 y.o. [phone] female here for a routine annual gynecologic exam.  Current complaints: 1.      Gynecologic History No LMP recorded. Contraception: OCP (estrogen/progesterone) Last Pap: 2017 neg/neg/neg Results were:normal Menarche-eights 10 Intervals-irregular, every 2 weeks to every 2 months Duration of flow-5-7 days long birth control pills; 9-10 days off birth control pills (and heavy) Pap smear history: No abnormals STI history: Negative History of fibroids.  Obstetric History OB History  Gravida Para Term Preterm AB Living  3 2 2  0 1 2  SAB TAB Ectopic Multiple Live Births  1 0 0 0 2    # Outcome Date GA Lbr Len/2nd Weight Sex Delivery Anes PTL Lv  3 SAB 2016          2 Term 2013   7 lb 1.6 oz (3.221 kg) M Vag-Spont   LIV  1 Term 2011   5 lb 11.2 oz (2.586 kg) M Vag-Spont   LIV      Past Medical History:  Diagnosis Date  . Fibroid   . Migraines   . PCOS (polycystic ovarian syndrome)     Past Surgical History:  Procedure Laterality Date  . NO PAST SURGERIES      Current Outpatient Prescriptions on File Prior to Visit  Medication Sig Dispense Refill  . butalbital-acetaminophen-caffeine (FIORICET, ESGIC) 50-325-40 MG tablet Take 1-2 tablets by mouth every 6 (six) hours as needed for headache. 12 tablet 0  . clomiPHENE (CLOMID) 50 MG tablet Take 1 tablet (50 mg total) by mouth daily. Take days 5 through 9 (Patient not taking: Reported on 09/04/2016) 5 tablet 1  . diphenhydrAMINE (BENADRYL) 25 mg capsule Take 2 capsules (50 mg total) by mouth every 6 (six) hours as needed. 60 capsule 0  . metoCLOPramide (REGLAN) 10 MG tablet Take 1 tablet (10 mg total) by mouth every 6 (six) hours as needed. 30 tablet 0  . Prenatal Vit-Fe Fumarate-FA (PRENATAL MULTIVITAMIN) TABS tablet Take 1 tablet by mouth daily at  12 noon.     No current facility-administered medications on file prior to visit.     Allergies  Allergen Reactions  . Penicillins Anaphylaxis  . Sumatriptan     Reaction: migraine    Social History   Social History  . Marital status: Legally Separated    Spouse name: N/A  . Number of children: N/A  . Years of education: N/A   Occupational History  . Not on file.   Social History Main Topics  . Smoking status: Current Every Day Smoker    Packs/day: 0.25    Types: Cigarettes  . Smokeless tobacco: Never Used  . Alcohol use No     Comment: weekly  . Drug use: No  . Sexual activity: Not Currently    Birth control/ protection: None   Other Topics Concern  . Not on file   Social History Narrative  . No narrative on file    Family History  Problem Relation Age of Onset  . Cancer Neg Hx   . Diabetes Neg Hx   . Heart disease Neg Hx     The following portions of the patient's history were reviewed and updated as appropriate: allergies, current medications, past family history, past medical history, past social history, past surgical history and problem list.  Review  of Systems ROS   Objective:   There were no vitals taken for this visit. CONSTITUTIONAL: Well-developed, well-nourished female in no acute distress.  PSYCHIATRIC: Normal mood and affect. Normal behavior. Normal judgment and thought content. [place]: Alert and oriented to person, place, and time. Normal muscle tone coordination. No cranial nerve deficit noted. HENT:  Normocephalic, atraumatic, External right and left ear normal. Oropharynx is clear and moist EYES: Conjunctivae and EOM are normal. Pupils are equal, round, and reactive to light. No scleral icterus.  NECK: Normal range of motion, supple, no masses.  Normal thyroid.  SKIN: Skin is warm and dry. No rash noted. Not diaphoretic. No erythema. No pallor. CARDIOVASCULAR: Normal heart rate noted, regular rhythm, no murmur. RESPIRATORY: Clear to  auscultation bilaterally. Effort and breath sounds normal, no problems with respiration noted. BREASTS: Symmetric in size. No masses, skin changes, nipple drainage, or lymphadenopathy. ABDOMEN: Soft, normal bowel sounds, no distention noted.  No tenderness, rebound or guarding.  BLADDER: Normal PELVIC:  External Genitalia: Normal  BUS: Normal  Vagina: Normal  Cervix: Normal  Uterus: Normal  Adnexa: Normal  RV: External Exam NormaI  MUSCULOSKELETAL: Normal range of motion. No tenderness.  No cyanosis, clubbing, or edema.  2+ distal pulses. LYMPHATIC: No Axillary, Supraclavicular, or Inguinal Adenopathy.    Assessment:   Annual gynecologic examination 26 y.o. Contraception: ocp Jolessa bmi-30  History of PCO History of irregular menstrual cycles  Plan:  Pap: Pap, Reflex if ASCUS-ct/ng Mammogram: Not Indicated Stool Guaiac Testing:  Not Indicated Labs: lipid vit d tsh a1c fbs Routine preventative health maintenance measures emphasized: Exercise/Diet/Weight control, Tobacco Warnings and Alcohol/Substance use risks Management of polycystic ovary disease was reviewed. For patient  not wanting to be pregnant, birth control pill use will be recommended. For patient wanting to get pregnant, Clomid will be started Return to [place]   Note: This dictation was prepared with Dragon dictation along with smaller phrase technology. Any transcriptional errors that result from this process are unintentional.

## 2017-01-24 ENCOUNTER — Encounter: Admitting: Obstetrics and Gynecology

## 2018-04-14 IMAGING — DX DG CHEST 1V PORT
1 series · 1 of 1 positions shown · non-contrast
Comparison: None.

CLINICAL DATA: Smoker, passed out at work today. Headaches

EXAM:
PORTABLE CHEST 1 VIEW

[chest ap]
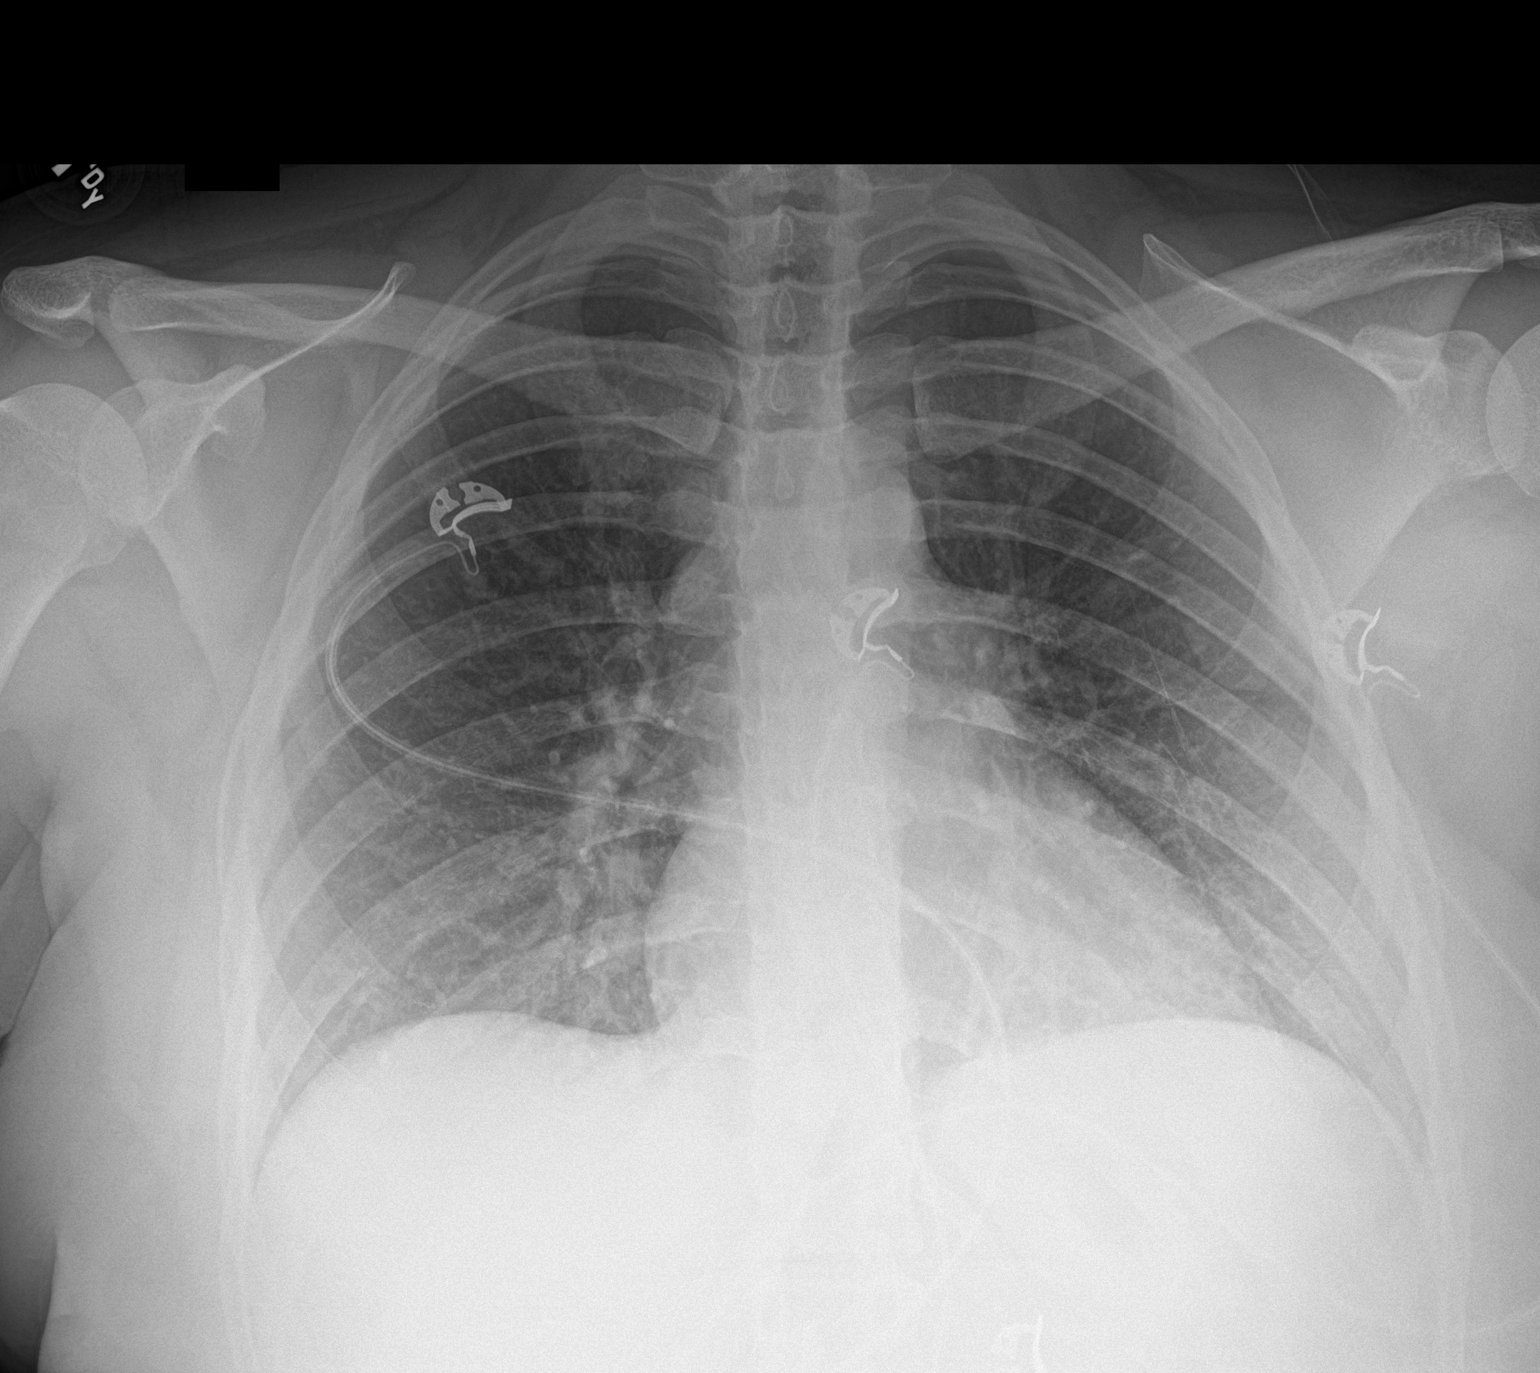

[1 of 1 positions shown; findings below may reference images not displayed]

FINDINGS: Numerous leads and wires project over the chest. Midline trachea.
Normal heart size and mediastinal contours. No pleural effusion or
pneumothorax. Vague increased density projecting over the left lower
chest is favored to be due to overlying breast tissue. Lungs
otherwise clear.
IMPRESSION: No acute cardiopulmonary disease.

## 2018-04-14 IMAGING — CT CT HEAD W/O CM
3 series · 16 of 46 positions shown, 19 images · non-contrast
Comparison: None.

CLINICAL DATA: Headache for 37 days.

EXAM:
CT HEAD WITHOUT CONTRAST
TECHNIQUE: Contiguous axial images were obtained from the base of the skull
through the vertex without intravenous contrast.

[Series 2: head wo · axial · 0.47mm/px · z∈[+138,+258]mm · 10 of 29 slices shown, 13 images]
[im 3/29  brain]
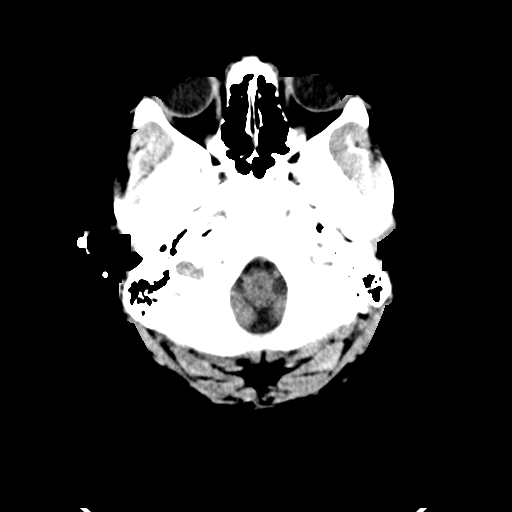
[im 3/29  bone]
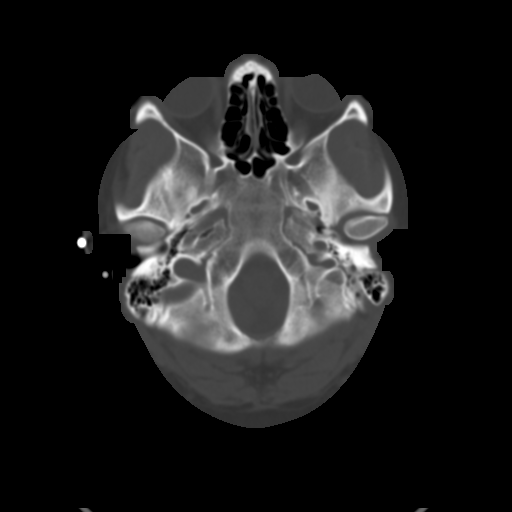
[im 6/29  brain]
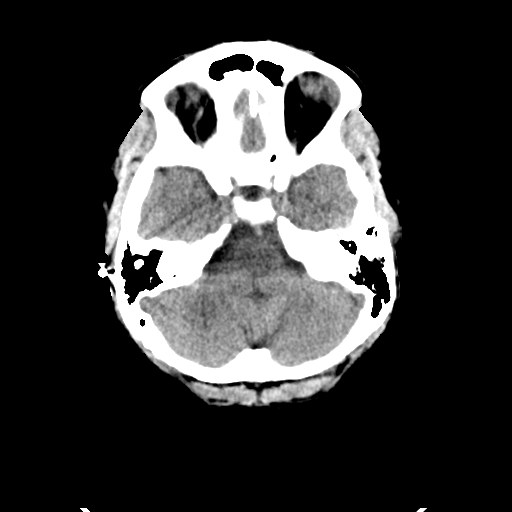
[im 8/29  brain]
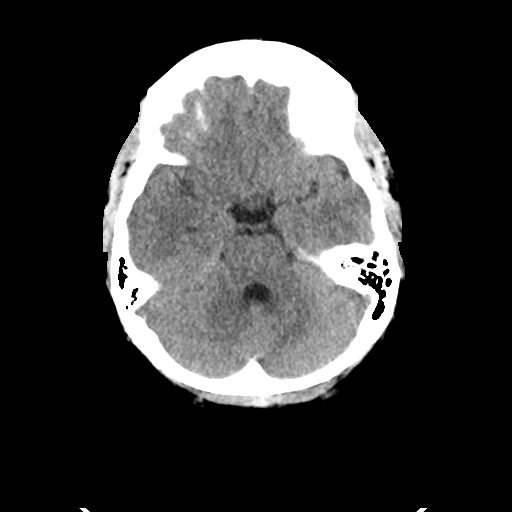
[im 11/29  brain]
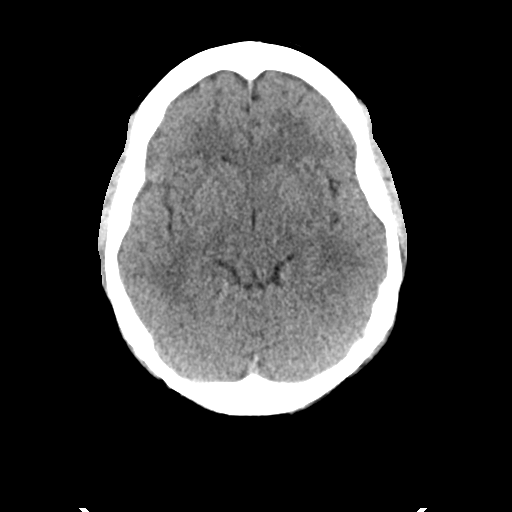
[im 14/29  brain]
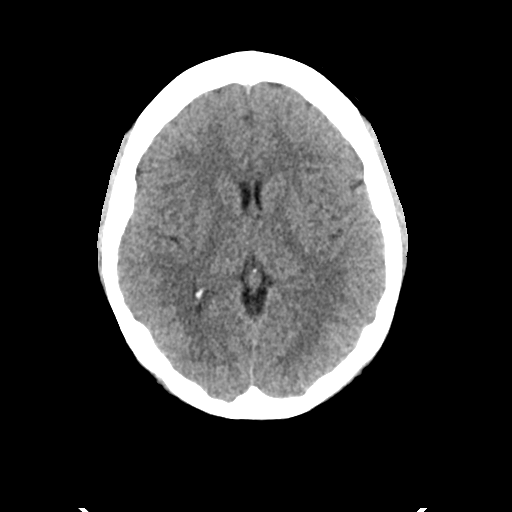
[im 14/29  bone]
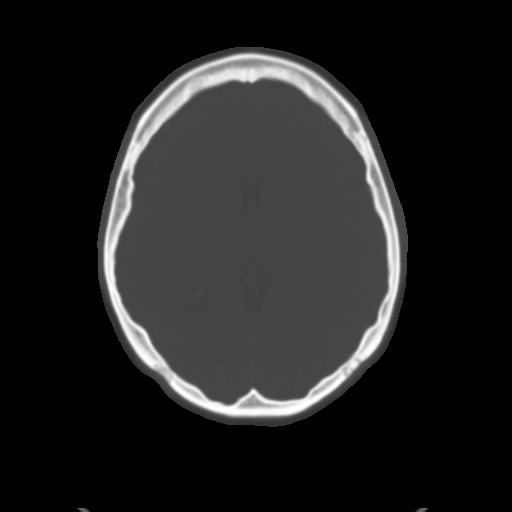
[im 16/29  brain]
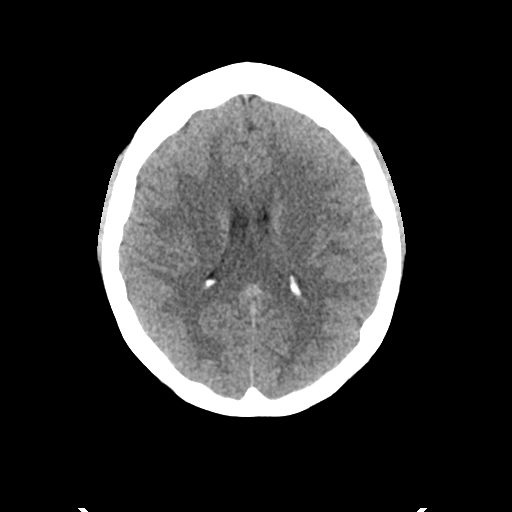
[im 19/29  brain]
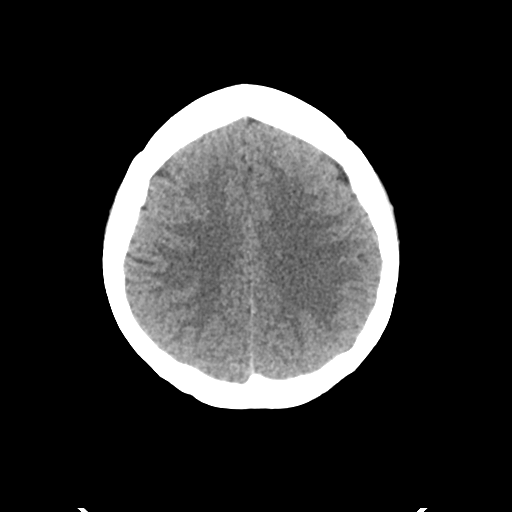
[im 22/29  brain]
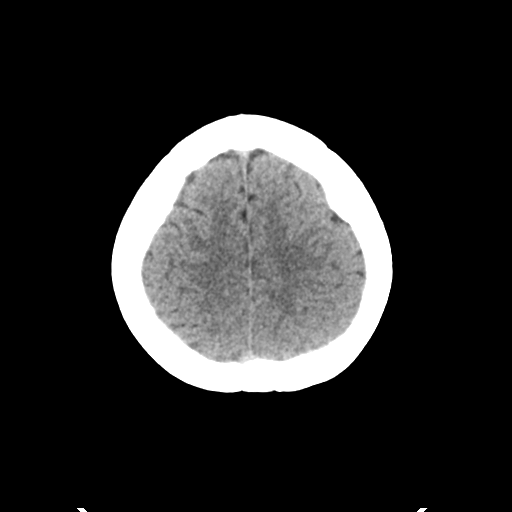
[im 24/29  brain]
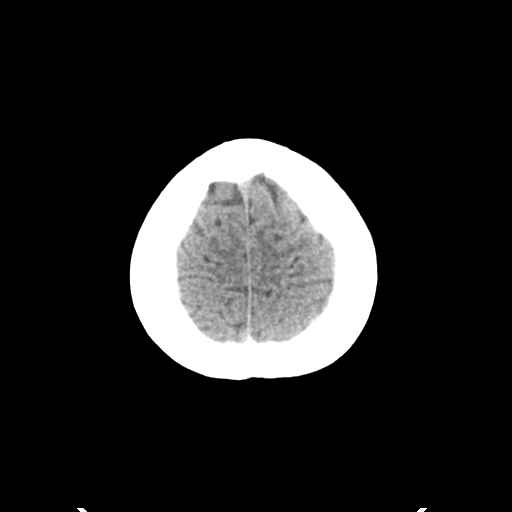
[im 24/29  bone]
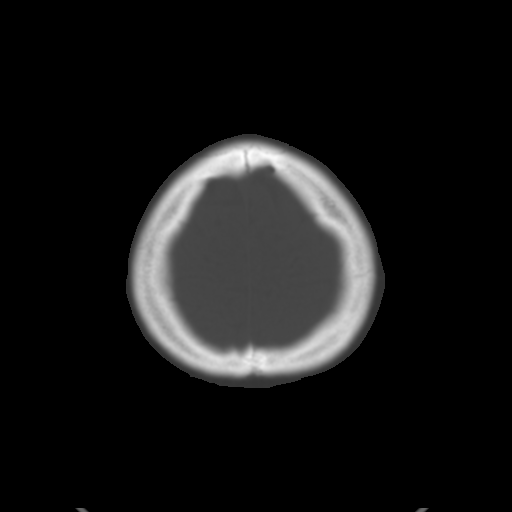
[im 27/29  brain]
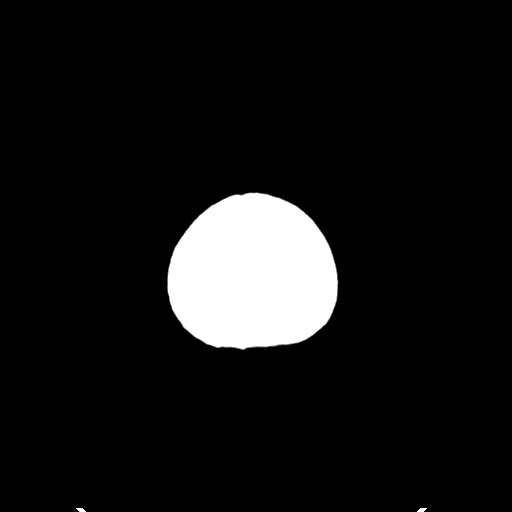

[Series 4: coronal soft tissue · coronal · 0.27mm/px · 3 of 60 slices shown]
[im 20/60  brain]
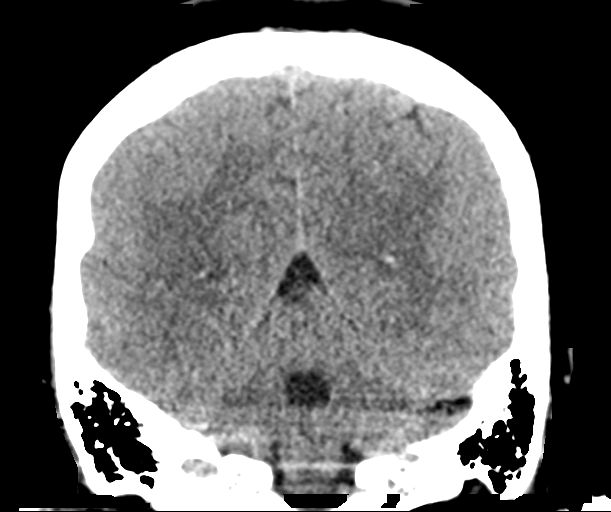
[im 27/60  brain]
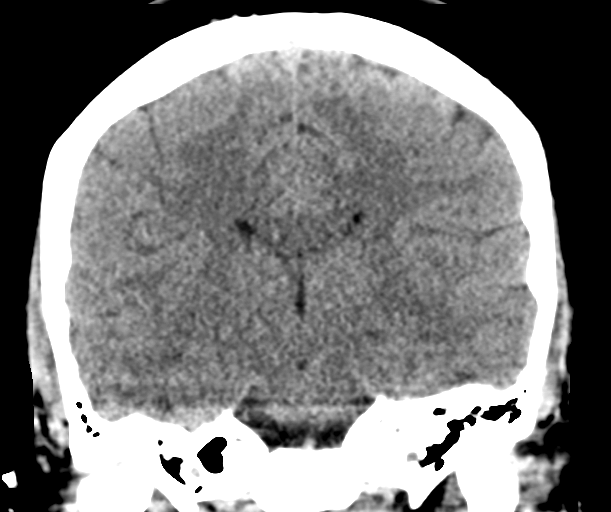
[im 33/60  brain]
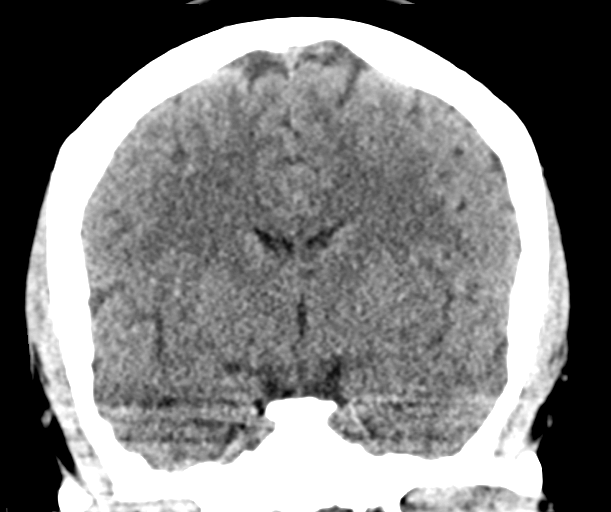

[Series 5: sagittal soft tissue · sagittal · 0.29mm/px · 3 of 53 slices shown]
[im 18/53  brain]
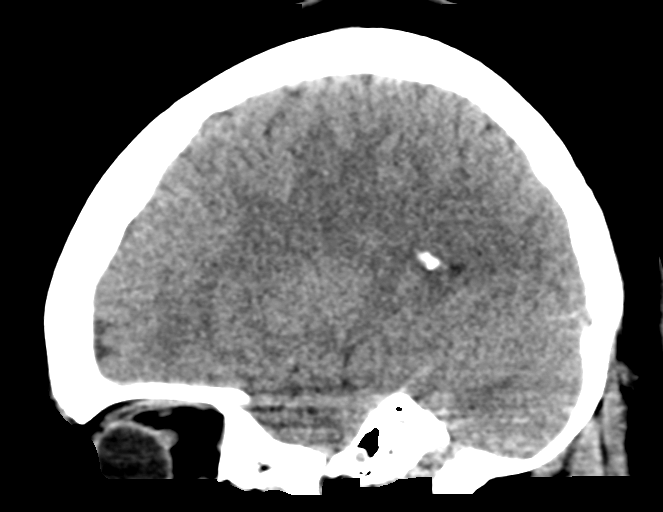
[im 27/53  brain]
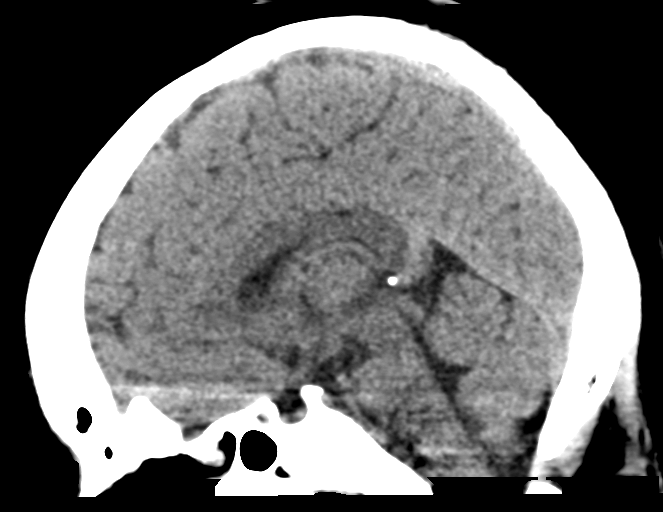
[im 35/53  brain]
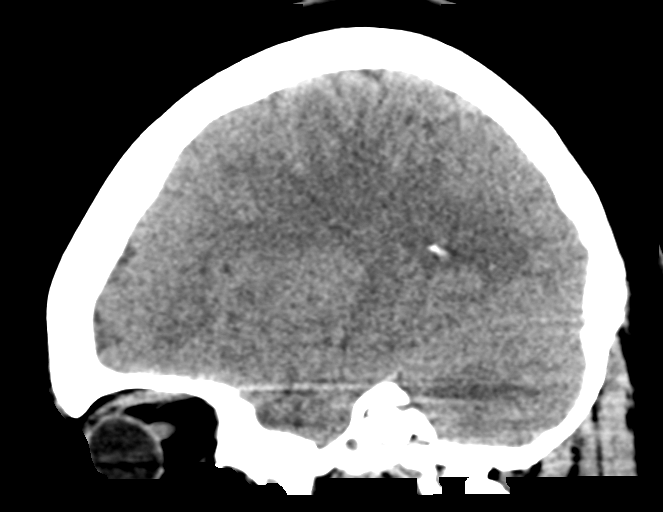

[16 of 46 positions shown; findings below may reference images not displayed]

FINDINGS: Brain: No evidence of acute infarction, hemorrhage, hydrocephalus,
extra-axial collection or mass lesion/mass effect.

Vascular: No hyperdense vessel or unexpected calcification.

Skull: Normal. Negative for fracture or focal lesion.

Sinuses/Orbits: No acute finding.

Other: None.
IMPRESSION: Normal.
